# Patient Record
Sex: Male | Born: 1965 | Race: White | Hispanic: No | Marital: Married | Smoking: Never smoker
Health system: Southern US, Community
[De-identification: ages and names within clinical notes are randomized; demographics above are authoritative.]

## PROBLEM LIST (undated history)

## (undated) DIAGNOSIS — E785 Hyperlipidemia, unspecified: Secondary | ICD-10-CM

## (undated) HISTORY — DX: Hyperlipidemia, unspecified: E78.5

---

## 1995-11-04 HISTORY — PX: VASECTOMY: SHX75

## 2004-06-19 ENCOUNTER — Encounter: Admission: RE | Admit: 2004-06-19 | Discharge: 2004-06-19 | Payer: Self-pay | Admitting: Family Medicine

## 2005-03-10 ENCOUNTER — Ambulatory Visit: Payer: Self-pay | Admitting: Family Medicine

## 2005-04-17 ENCOUNTER — Emergency Department (HOSPITAL_COMMUNITY): Admission: EM | Admit: 2005-04-17 | Discharge: 2005-04-18 | Payer: Self-pay | Admitting: Emergency Medicine

## 2005-10-21 ENCOUNTER — Ambulatory Visit: Payer: Self-pay | Admitting: Internal Medicine

## 2006-05-04 ENCOUNTER — Ambulatory Visit: Payer: Self-pay | Admitting: Family Medicine

## 2006-05-05 ENCOUNTER — Encounter: Admission: RE | Admit: 2006-05-05 | Discharge: 2006-05-05 | Payer: Self-pay | Admitting: Family Medicine

## 2006-06-16 ENCOUNTER — Ambulatory Visit: Payer: Self-pay | Admitting: Family Medicine

## 2006-10-20 ENCOUNTER — Ambulatory Visit: Payer: Self-pay | Admitting: Family Medicine

## 2006-10-20 LAB — CONVERTED CEMR LAB
AST: 36 units/L (ref 0–37)
Alkaline Phosphatase: 72 units/L (ref 39–117)
Total Bilirubin: 1.3 mg/dL — ABNORMAL HIGH (ref 0.3–1.2)

## 2006-12-09 ENCOUNTER — Ambulatory Visit: Payer: Self-pay | Admitting: Family Medicine

## 2006-12-09 LAB — CONVERTED CEMR LAB: Total CK: 107 U/L

## 2007-03-31 ENCOUNTER — Ambulatory Visit: Payer: Self-pay | Admitting: Family Medicine

## 2007-04-21 ENCOUNTER — Telehealth (INDEPENDENT_AMBULATORY_CARE_PROVIDER_SITE_OTHER): Payer: Self-pay | Admitting: *Deleted

## 2007-04-26 ENCOUNTER — Encounter (INDEPENDENT_AMBULATORY_CARE_PROVIDER_SITE_OTHER): Payer: Self-pay | Admitting: *Deleted

## 2007-06-28 ENCOUNTER — Ambulatory Visit: Payer: Self-pay | Admitting: Family Medicine

## 2007-06-28 DIAGNOSIS — Q825 Congenital non-neoplastic nevus: Secondary | ICD-10-CM | POA: Insufficient documentation

## 2007-06-28 DIAGNOSIS — E785 Hyperlipidemia, unspecified: Secondary | ICD-10-CM

## 2007-06-28 DIAGNOSIS — R413 Other amnesia: Secondary | ICD-10-CM

## 2007-06-28 DIAGNOSIS — Q674 Other congenital deformities of skull, face and jaw: Secondary | ICD-10-CM

## 2007-06-28 LAB — CONVERTED CEMR LAB
Blood in Urine, dipstick: NEGATIVE
Ketones, urine, test strip: NEGATIVE
Nitrite: NEGATIVE
Specific Gravity, Urine: 1.015
WBC Urine, dipstick: NEGATIVE

## 2007-07-13 ENCOUNTER — Telehealth (INDEPENDENT_AMBULATORY_CARE_PROVIDER_SITE_OTHER): Payer: Self-pay | Admitting: *Deleted

## 2007-07-13 LAB — CONVERTED CEMR LAB
ALT: 39 units/L (ref 0–53)
AST: 31 units/L (ref 0–37)
Albumin: 4 g/dL (ref 3.5–5.2)
BUN: 16 mg/dL (ref 6–23)
Basophils Absolute: 0.1 10*3/uL (ref 0.0–0.1)
Basophils Relative: 0.9 % (ref 0.0–1.0)
Calcium: 9.2 mg/dL (ref 8.4–10.5)
Eosinophils Relative: 4.3 % (ref 0.0–5.0)
GFR calc Af Amer: 107 mL/min
HCT: 40.5 % (ref 39.0–52.0)
Hemoglobin: 14 g/dL (ref 13.0–17.0)
LDL Cholesterol: 114 mg/dL — ABNORMAL HIGH (ref 0–99)
MCHC: 34.6 g/dL (ref 30.0–36.0)
Neutro Abs: 3.4 10*3/uL (ref 1.4–7.7)
Platelets: 274 10*3/uL (ref 150–400)
RBC: 4.57 M/uL (ref 4.22–5.81)
RDW: 12.9 % (ref 11.5–14.6)
Sodium: 142 meq/L (ref 135–145)
Total CHOL/HDL Ratio: 5.7
Vitamin B-12: 271 pg/mL (ref 211–911)

## 2007-07-16 ENCOUNTER — Ambulatory Visit: Payer: Self-pay | Admitting: Family Medicine

## 2007-07-23 ENCOUNTER — Encounter (INDEPENDENT_AMBULATORY_CARE_PROVIDER_SITE_OTHER): Payer: Self-pay | Admitting: *Deleted

## 2007-07-23 LAB — CONVERTED CEMR LAB: Testosterone-% Free: 2.6 % (ref 1.6–2.9)

## 2008-02-18 ENCOUNTER — Ambulatory Visit: Payer: Self-pay | Admitting: Family Medicine

## 2008-02-19 LAB — CONVERTED CEMR LAB
ALT: 39 units/L (ref 0–53)
AST: 27 units/L (ref 0–37)
Albumin: 4.6 g/dL (ref 3.5–5.2)
Direct LDL: 192 mg/dL
HDL: 30.8 mg/dL — ABNORMAL LOW (ref >39.0)
Total Bilirubin: 1.2 mg/dL (ref 0.3–1.2)
Total Protein: 7.4 g/dL (ref 6.0–8.3)
Triglycerides: 196 mg/dL — ABNORMAL HIGH (ref 0–149)
VLDL: 39 mg/dL (ref 0–40)

## 2008-02-21 ENCOUNTER — Encounter (INDEPENDENT_AMBULATORY_CARE_PROVIDER_SITE_OTHER): Payer: Self-pay | Admitting: *Deleted

## 2008-05-29 ENCOUNTER — Telehealth (INDEPENDENT_AMBULATORY_CARE_PROVIDER_SITE_OTHER): Payer: Self-pay | Admitting: *Deleted

## 2008-06-30 ENCOUNTER — Ambulatory Visit: Payer: Self-pay | Admitting: Family Medicine

## 2008-07-12 LAB — CONVERTED CEMR LAB
ALT: 57 units/L — ABNORMAL HIGH (ref 0–53)
Albumin: 4.5 g/dL (ref 3.5–5.2)
Alkaline Phosphatase: 59 units/L (ref 39–117)
Cholesterol: 211 mg/dL (ref 0–200)
Total Bilirubin: 1.3 mg/dL — ABNORMAL HIGH (ref 0.3–1.2)
VLDL: 28 mg/dL (ref 0–40)

## 2008-07-13 ENCOUNTER — Encounter (INDEPENDENT_AMBULATORY_CARE_PROVIDER_SITE_OTHER): Payer: Self-pay | Admitting: *Deleted

## 2008-07-24 ENCOUNTER — Telehealth (INDEPENDENT_AMBULATORY_CARE_PROVIDER_SITE_OTHER): Payer: Self-pay | Admitting: *Deleted

## 2008-11-01 ENCOUNTER — Telehealth (INDEPENDENT_AMBULATORY_CARE_PROVIDER_SITE_OTHER): Payer: Self-pay | Admitting: *Deleted

## 2008-11-06 ENCOUNTER — Telehealth (INDEPENDENT_AMBULATORY_CARE_PROVIDER_SITE_OTHER): Payer: Self-pay | Admitting: *Deleted

## 2009-01-02 ENCOUNTER — Ambulatory Visit: Payer: Self-pay | Admitting: Family Medicine

## 2009-01-02 DIAGNOSIS — J029 Acute pharyngitis, unspecified: Secondary | ICD-10-CM

## 2009-01-08 ENCOUNTER — Encounter (INDEPENDENT_AMBULATORY_CARE_PROVIDER_SITE_OTHER): Payer: Self-pay | Admitting: *Deleted

## 2009-02-01 ENCOUNTER — Telehealth (INDEPENDENT_AMBULATORY_CARE_PROVIDER_SITE_OTHER): Payer: Self-pay | Admitting: *Deleted

## 2009-02-05 ENCOUNTER — Telehealth (INDEPENDENT_AMBULATORY_CARE_PROVIDER_SITE_OTHER): Payer: Self-pay | Admitting: *Deleted

## 2009-02-23 ENCOUNTER — Telehealth (INDEPENDENT_AMBULATORY_CARE_PROVIDER_SITE_OTHER): Payer: Self-pay | Admitting: *Deleted

## 2009-05-18 ENCOUNTER — Telehealth (INDEPENDENT_AMBULATORY_CARE_PROVIDER_SITE_OTHER): Payer: Self-pay | Admitting: *Deleted

## 2009-07-30 ENCOUNTER — Telehealth: Payer: Self-pay | Admitting: Family Medicine

## 2009-08-29 ENCOUNTER — Telehealth (INDEPENDENT_AMBULATORY_CARE_PROVIDER_SITE_OTHER): Payer: Self-pay | Admitting: *Deleted

## 2009-08-31 ENCOUNTER — Ambulatory Visit: Payer: Self-pay | Admitting: Family Medicine

## 2009-09-10 ENCOUNTER — Telehealth (INDEPENDENT_AMBULATORY_CARE_PROVIDER_SITE_OTHER): Payer: Self-pay | Admitting: *Deleted

## 2009-09-10 LAB — CONVERTED CEMR LAB
Albumin: 4.5 g/dL (ref 3.5–5.2)
Alkaline Phosphatase: 48 units/L (ref 39–117)
HDL: 31.3 mg/dL — ABNORMAL LOW (ref >39.00)
Total CHOL/HDL Ratio: 6
Triglycerides: 145 mg/dL (ref 0.0–149.0)
VLDL: 29 mg/dL (ref 0.0–40.0)

## 2009-09-14 ENCOUNTER — Encounter (INDEPENDENT_AMBULATORY_CARE_PROVIDER_SITE_OTHER): Payer: Self-pay | Admitting: *Deleted

## 2010-01-03 ENCOUNTER — Telehealth (INDEPENDENT_AMBULATORY_CARE_PROVIDER_SITE_OTHER): Payer: Self-pay | Admitting: *Deleted

## 2010-01-25 ENCOUNTER — Ambulatory Visit: Payer: Self-pay | Admitting: Family Medicine

## 2010-01-29 LAB — CONVERTED CEMR LAB
ALT: 70 units/L — ABNORMAL HIGH (ref 0–53)
Albumin: 4.6 g/dL (ref 3.5–5.2)
Cholesterol: 168 mg/dL (ref 0–200)
HDL: 34.5 mg/dL — ABNORMAL LOW (ref >39.00)
LDL Cholesterol: 106 mg/dL — ABNORMAL HIGH (ref 0–99)
Total CHOL/HDL Ratio: 5
Triglycerides: 140 mg/dL (ref 0.0–149.0)
VLDL: 28 mg/dL (ref 0.0–40.0)

## 2010-04-02 ENCOUNTER — Telehealth (INDEPENDENT_AMBULATORY_CARE_PROVIDER_SITE_OTHER): Payer: Self-pay | Admitting: *Deleted

## 2010-07-04 ENCOUNTER — Ambulatory Visit: Payer: Self-pay | Admitting: Family Medicine

## 2010-07-11 ENCOUNTER — Telehealth (INDEPENDENT_AMBULATORY_CARE_PROVIDER_SITE_OTHER): Payer: Self-pay | Admitting: *Deleted

## 2010-08-08 ENCOUNTER — Ambulatory Visit: Payer: Self-pay | Admitting: Family Medicine

## 2010-08-14 LAB — CONVERTED CEMR LAB
HDL: 34 mg/dL — ABNORMAL LOW (ref >39.00)
Sodium: 137 meq/L (ref 135–145)
Total CHOL/HDL Ratio: 8
Triglycerides: 235 mg/dL — ABNORMAL HIGH (ref 0.0–149.0)
VLDL: 47 mg/dL — ABNORMAL HIGH (ref 0.0–40.0)

## 2010-08-21 ENCOUNTER — Telehealth (INDEPENDENT_AMBULATORY_CARE_PROVIDER_SITE_OTHER): Payer: Self-pay | Admitting: *Deleted

## 2010-12-01 LAB — CONVERTED CEMR LAB
ALT: 46 units/L — ABNORMAL HIGH (ref 0–40)
AST: 26 units/L (ref 0–37)
Albumin: 4.3 g/dL (ref 3.5–5.2)
Bilirubin, Direct: 0.1 mg/dL (ref 0.0–0.3)

## 2010-12-04 NOTE — Letter (Signed)
Summary: MMSE Form  MMSE Form   Imported By: Lanelle Bal 07/16/2010 10:20:16  _____________________________________________________________________  External Attachment:    Type:   Image     Comment:   External Document

## 2010-12-04 NOTE — Progress Notes (Signed)
Summary: Refill Request  Phone Note Refill Request Call back at [phone] Message from:  Pharmacy on Apr 02, 2010 12:33 PM  Refills Requested: Medication #1:  CRESTOR 20 MG TABS take 1 by mouth at bedtime. NEEDS LABWORK.   Dosage confirmed as above?Dosage Confirmed   Supply Requested: 3 months [facility]  Next Appointment Scheduled: none Initial call taken by: Harold Barban,  Apr 02, 2010 12:33 PM    New/Updated Medications: CRESTOR 20 MG TABS (ROSUVASTATIN CALCIUM) take 1 by mouth at bedtime. Prescriptions: CRESTOR 20 MG TABS (ROSUVASTATIN CALCIUM) take 1 by mouth at bedtime.  #90 x 1   Entered by:   Army Fossa CMA   Authorized by:   Loreen Freud DO   Signed by:   Army Fossa CMA on 04/02/2010   Method used:   Faxed to ...       [facility] ([facility]order)       [address] [place], [place]  [address]       Ph: [phone]       Fax: [phone]   RxID:   [id number]

## 2010-12-04 NOTE — Progress Notes (Signed)
Summary: -NEEDS 30 PILLS INSTEAD OF 15 FOR LIPITOR  Phone Note Call from Patient Call back at [phone]   Caller: Patient Summary of Call: FYI-----PATIENT CALLED TO LET DR LOWNE KNOW THAT $4.00 COPAY CARD FOR LIPITOR IS ONLY GOOD IF YOU GET A 30 DAY SUPPLY ON THE PRESCRIPTION--SINCE HE TAKES IT EVERY OTHER DAY, HE COULD NOT USE THIS CARD TO TAKE ADVANTAGE OF THE SAVINGS--HE WANTS A NOTE IN HIS CHART THAT HE NEEDS 30 PILLS SO HE CAN GET THE SAVINGS  SINCE HE JUST GOT THIS FILLED, HE WONT BE CALLING FOR A REFILL FOR A FEW  WEEKS Initial call taken by: Jerolyn Shin,  August 21, 2010 10:13 AM  Follow-up for Phone Call        pt aware new rx sent in but to still take med every other day..........Marland KitchenFelecia Deloach CMA  August 21, 2010 10:27 AM     New/Updated Medications: LIPITOR 10 MG TABS (ATORVASTATIN CALCIUM) Take 1 tab as directed Prescriptions: LIPITOR 10 MG TABS (ATORVASTATIN CALCIUM) Take 1 tab as directed  #30 x 1   Entered by:   Jeremy Johann CMA   Authorized by:   Loreen Freud DO   Signed by:   Jeremy Johann CMA on 08/21/2010   Method used:   Faxed to ...       [facility] [phone]* (retail)       [address]       [place], [place]  [address]       Ph: [phone]       Fax: [phone]   RxID:   [id number]

## 2010-12-04 NOTE — Assessment & Plan Note (Signed)
Summary: side effect crestor/cbs   Vital Signs:  Patient profile:   45 year old male Weight:      196.8 pounds BMI:     29.38 Temp:     98.6 degrees F oral Pulse rate:   80 / minute Pulse rhythm:   regular BP sitting:   124 / 72  (left arm)  Vitals Entered By: Almeta Monas CMA Duncan Dull) (July 04, 2010 4:07 PM) CC: per pt having a reaction to crestor and wants to discuss changing    History of Present Illness: Pt here c/o memory loss with crestor.  Pt could not even remember a date last week.  No new muscle aches.    Current Medications (verified): 1)  Crestor 20 Mg Tabs (Rosuvastatin Calcium) .... Take 1 By Mouth At Bedtime. 2)  Tricor 145 Mg  Tabs (Fenofibrate) .Marland Kitchen.. 1 By Mouth Once Daily  Allergies: 1)  ! Codeine  Physical Exam  General:  Well-developed,well-nourished,in no acute distress; alert,appropriate and cooperative throughout examination Psych:  Cognition and judgment appear intact. Alert and cooperative with normal attention span and concentration. No apparent delusions, illusions, hallucinations   Impression & Recommendations:  Problem # 1:  HYPERLIPIDEMIA (ICD-272.4)  His updated medication list for this problem includes:    Crestor 20 Mg Tabs (Rosuvastatin calcium) .Marland Kitchen... Take 1 by mouth at bedtime.    Tricor 145 Mg Tabs (Fenofibrate) .Marland Kitchen... 1 by mouth once daily  Labs Reviewed: SGOT: 53 (01/25/2010)   SGPT: 70 (01/25/2010)   HDL:34.50 (01/25/2010), 31.30 (08/31/2009)  LDL:106 (01/25/2010), DEL (06/30/2008)  Chol:168 (01/25/2010), 203 (08/31/2009)  Trig:140.0 (01/25/2010), 145.0 (08/31/2009)  Problem # 2:  MEMORY LOSS (ICD-780.93) MMSE 29/20 hold crestor 2 weeks   Complete Medication List: 1)  Crestor 20 Mg Tabs (Rosuvastatin calcium) .... Take 1 by mouth at bedtime. 2)  Tricor 145 Mg Tabs (Fenofibrate) .Marland Kitchen.. 1 by mouth once daily  Patient Instructions: 1)  hold crestor for 2 weeks---call office to let us know how you are feeling.    Prescriptions: TRICOR 145 MG  TABS (FENOFIBRATE) 1 by mouth once daily**LABS DUE NOW**  #90 x 3   Entered and Authorized by:   Loreen Freud DO   Signed by:   Loreen Freud DO on 07/04/2010   Method used:   Faxed to ...       [facility] (mail-order)       [address] [place], [place]  [address]       Ph: [phone]       Fax: [phone]   RxID:   [id number]

## 2010-12-04 NOTE — Progress Notes (Signed)
Summary: Refill Request  Phone Note Refill Request Message from:  Pharmacy on [facility] Fax #: [phone]  Refills Requested: Medication #1:  CRESTOR 20 MG TABS take 1 by mouth at bedtime   Dosage confirmed as above?Dosage Confirmed   Supply Requested: 3 months Next Appointment Scheduled: none Initial call taken by: Harold Barban,  January 03, 2010 9:04 AM    New/Updated Medications: CRESTOR 20 MG TABS (ROSUVASTATIN CALCIUM) take 1 by mouth at bedtime. NEEDS LABWORK. Prescriptions: CRESTOR 20 MG TABS (ROSUVASTATIN CALCIUM) take 1 by mouth at bedtime. NEEDS LABWORK.  #90 x 0   Entered by:   Army Fossa CMA   Authorized by:   Loreen Freud DO   Signed by:   Army Fossa CMA on 01/03/2010   Method used:   [facility]ronically [facility]        [facility]       [address]       [place], [place]  [address]       Ph: [phone]       Fax: [phone]   RxID:   [id number]

## 2010-12-04 NOTE — Progress Notes (Signed)
Summary:  FYI changes in patient health (lmom 9/9 9/13)  Phone Note Call from Patient   Caller: Spouse Summary of Call: Pt wife states that since starting cholesterol  med pt has been very moody, and irritable. Pt has become very distance from his family because no one wants to deal with him due to his mood swing. Pt wife states that pt denies any of these symptoms and only thinks that med is affecting his memory. Pt wife would like to let dr Laury Axon know this because pt will not admit to it. Advise pt wife if she is having concern with pt health it would be good idea to come in with pt at [facility] so that concerns can be expressed. Pt wife decline stating that she does not want pt to hear it from her but she rather dr Laury Axon ask him the questions......................Marland KitchenFelecia Deloach CMA  July 11, 2010 9:34 AM   Follow-up for Phone Call        we took him off the med-- but he has to admit to me there is a problem for me to treat it---she should come in with him. Follow-up by: Loreen Freud DO,  July 11, 2010 9:49 AM  Additional Follow-up for Phone Call Additional follow up Details #1::        Left message to call back Almeta Monas CMA Duncan Dull)  July 12, 2010 3:22 PM  Left message to call back Almeta Monas CMA Duncan Dull)  July 16, 2010 8:23 AM     Additional Follow-up for Phone Call Additional follow up Details #2::    Spk with pt and said he sypmtoms have improved since he has been off of the crestor, he will call in an schdeule a lab appt to have his cholesterol rechecked later this month. Currently denies any other problems. Call ended Follow-up by: Almeta Monas CMA Duncan Dull),  July 16, 2010 12:39 PM

## 2011-03-04 ENCOUNTER — Encounter: Payer: Self-pay | Admitting: Family Medicine

## 2011-03-05 ENCOUNTER — Encounter: Payer: Self-pay | Admitting: Family Medicine

## 2011-03-05 ENCOUNTER — Ambulatory Visit (INDEPENDENT_AMBULATORY_CARE_PROVIDER_SITE_OTHER): Payer: BC Managed Care – PPO | Admitting: Family Medicine

## 2011-03-05 VITALS — BP 100/66 | HR 76 | Temp 98.4°F | Wt 193.0 lb

## 2011-03-05 DIAGNOSIS — M545 Low back pain: Secondary | ICD-10-CM

## 2011-03-05 DIAGNOSIS — M549 Dorsalgia, unspecified: Secondary | ICD-10-CM

## 2011-03-05 LAB — POCT URINALYSIS DIPSTICK
Glucose, UA: NEGATIVE
Nitrite, UA: NEGATIVE
Spec Grav, UA: 1.025
Urobilinogen, UA: 0.2

## 2011-03-05 MED ORDER — CYCLOBENZAPRINE HCL 10 MG PO TABS: 10.0000 mg | ORAL_TABLET | Freq: Three times a day (TID) | ORAL | Status: DC | PRN

## 2011-03-05 NOTE — Progress Notes (Signed)
  Subjective:    Glenn Nelson is a 45 y.o. male who presents for evaluation of low back pain. The patient has had no prior back problems. Symptoms have been present for 2 weeks and are unchanged.  Onset was related to / precipitated by no known injury and he was playing ice hockey a few weeks ago. The pain is located in the right lumbar area and radiates to the right lower leg, left lower leg. The pain is described as spasms and occurs intermittently. He rates his pain as mild. Symptoms are exacerbated by lying down, sitting, standing and twisting. Symptoms are improved by NSAIDs. He has also tried nothing which provided no symptom relief. He has tingling in the right leg and tingling in the left leg associated with the back pain. The patient has no "red flag" history indicative of complicated back pain.  The following portions of the patient's history were reviewed and updated as appropriate: allergies, current medications, past family history, past medical history, past social history, past surgical history and problem list.  Review of Systems Pertinent items are noted in HPI.    Objective:   Full range of motion without pain, no tenderness, no spasm, no curvature. Normal reflexes, gait, strength and negative straight-leg raise.    Assessment:    Nonspecific acute low back pain    Plan:    Natural history and expected course discussed. Questions answered. Agricultural engineer distributed. Heat to affected area as needed for local pain relief. NSAIDs per medication orders. Muscle relaxants per medication orders. f/u prn

## 2011-03-05 NOTE — Progress Notes (Deleted)
  Subjective:    Glenn Nelson is a 45 y.o. male who presents for follow up of low back problems. Current symptoms include: {back symptoms:16447}. Symptoms have {clinical course:13112} from the previous visit. Exacerbating factors identified by the patient are {back exacerbating/alleviating factors:16449}.  {Common ambulatory SmartLinks:19316}    Objective:    {Exam, Complete:17964}    Assessment:    {diagnosis; back pain:16452}    Plan:    {Plan; back pain:10213}  Subjective:    Glenn Nelson is a 45 y.o. male who presents for evaluation of low back pain. The patient has had no prior back problems. Symptoms have been present for 2 weeks and are unchanged.  Onset was related to / precipitated by --no known injury but he was playing ice hockey a few weeks ago . The pain is located in the right lumbar area and radiates to the right lower leg, left lower leg. The pain is described as spasms and occurs intermittently. He rates his pain as mild. Symptoms are exacerbated by extension, flexion, lying down, sitting, standing and twisting. Symptoms are improved by nothing. He has also tried nothing which provided no symptom relief. He has tingling in the right leg and tingling in the left leg associated with the back pain. The patient has no "red flag" history indicative of complicated back pain.  The following portions of the patient's history were reviewed and updated as appropriate: allergies, current medications, past family history, past medical history, past social history, past surgical history and problem list.  Review of Systems Pertinent items are noted in HPI.    Objective:   Full range of motion without pain, no tenderness, no spasm, no curvature. Normal reflexes, gait, strength and negative straight-leg raise.    Assessment:    Nonspecific acute low back pain    Plan:    Natural history and expected course discussed. Questions answered. Agricultural engineer distributed. Proper  lifting, bending technique discussed. Heat to affected area as needed for local pain relief. NSAIDs per medication orders. Muscle relaxants per medication orders. f/u prn

## 2011-03-05 NOTE — Patient Instructions (Signed)
Back Pain (Lumbosacral Strain) Back pain is one of the most common causes of pain. There are many causes of back pain. Most are not serious conditions.  CAUSES Your backbone (spinal column) is made up of 24 main vertebral bodies, the sacrum, and the coccyx. These are held together by muscles and tough, fibrous tissue (ligaments). Nerve roots pass through the openings between the vertebrae. A sudden move or injury to the back may cause injury to, or pressure on, these nerves. This may result in localized back pain or pain movement (radiation) into the buttocks, down the leg, and into the foot. Sharp, shooting pain from the buttock down the back of the leg (sciatica) is frequently associated with a ruptured (herniated) disc. Pain may be caused by muscle spasm alone. Your caregiver can often find the cause of your pain by the details of your symptoms and an exam. In some cases, you may need tests (such as X-rays). Your caregiver will work with you to decide if any tests are needed based on your specific exam. HOME CARE INSTRUCTIONS  Avoid an underactive lifestyle. Active exercise, as directed by your caregiver, is your greatest weapon against back pain.   Avoid hard physical activities (tennis, racquetball, water-skiing) if you are not in proper physical condition for it. This may aggravate and/or create problems.   If you have a back problem, avoid sports requiring sudden body movements. Swimming and walking are generally safer activities.   Maintain good posture.   Avoid becoming overweight (obese).   Use bed rest for only the most extreme, sudden (acute) episode. Your caregiver will help you determine how much bed rest is necessary.   For acute conditions, you may put ice on the injured area.   Put ice in a plastic bag.   Place a towel between your skin and the bag.   Leave the ice on for 15 minutes at a time, every 2 hours, or as needed.   After you are improved and more active, it may  help to apply heat for 30 minutes before activities.  See your caregiver if you are having pain that lasts longer than expected. Your caregiver can advise appropriate exercises and/or therapy if needed. With conditioning, most back problems can be avoided. SEEK IMMEDIATE MEDICAL CARE IF:  You have numbness, tingling, weakness, or problems with the use of your arms or legs.   You experience severe back pain not relieved with medicines.   There is a change in bowel or bladder control.   You have increasing pain in any area of the body, including your belly (abdomen).   You notice shortness of breath, dizziness, or feel faint.   You feel sick to your stomach (nauseous), are throwing up (vomiting), or become sweaty.   You notice discoloration of your toes or legs, or your feet get very cold.   Your back pain is getting worse.   You have an oral temperature above 100.4, not controlled by medicine.  MAKE SURE YOU:   Understand these instructions.   Will watch your condition.   Will get help right away if you are not doing well or get worse.  Document Released: 07/30/2005 Document Re-Released: 01/14/2010 [employer] Patient Information 2011 [employer]. 

## 2011-04-04 ENCOUNTER — Other Ambulatory Visit: Payer: Self-pay | Admitting: Family Medicine

## 2011-04-04 MED ORDER — ATORVASTATIN CALCIUM 10 MG PO TABS: ORAL_TABLET | ORAL | Status: DC

## 2011-04-04 NOTE — Telephone Encounter (Signed)
Labs are overdue---Letter mailed      KP 

## 2011-05-15 ENCOUNTER — Other Ambulatory Visit: Payer: BC Managed Care – PPO

## 2011-05-15 NOTE — Progress Notes (Signed)
Labs only

## 2011-05-27 ENCOUNTER — Other Ambulatory Visit: Payer: Self-pay | Admitting: Family Medicine

## 2011-05-27 MED ORDER — ATORVASTATIN CALCIUM 10 MG PO TABS: ORAL_TABLET | ORAL | Status: DC

## 2011-05-27 NOTE — Telephone Encounter (Signed)
FAXED     KP 

## 2011-06-19 ENCOUNTER — Ambulatory Visit (INDEPENDENT_AMBULATORY_CARE_PROVIDER_SITE_OTHER): Payer: BC Managed Care – PPO | Admitting: Family Medicine

## 2011-06-19 VITALS — BP 110/82 | HR 77 | Temp 98.1°F | Wt 197.0 lb

## 2011-06-19 DIAGNOSIS — E785 Hyperlipidemia, unspecified: Secondary | ICD-10-CM

## 2011-06-19 MED ORDER — COQ10 100 MG PO CAPS: ORAL_CAPSULE | ORAL | Status: DC

## 2011-06-19 MED ORDER — PITAVASTATIN CALCIUM 2 MG PO TABS: ORAL_TABLET | ORAL | Status: DC

## 2011-06-19 NOTE — Assessment & Plan Note (Signed)
Try livalo 2 mg daily---if memory is affected again---try welcol Recheck 3 months

## 2011-06-19 NOTE — Patient Instructions (Signed)

## 2011-06-19 NOTE — Progress Notes (Signed)
  Subjective:    Patient ID: Glenn Nelson, male    DOB: 1966/08/29, 45 y.o.   MRN: [id number]  HPI Pt here to review bhl.  No complatins.   Memory improves when statin stopped.  On no statin now   Review of Systems As above    Objective:   Physical Exam  Constitutional: He is oriented to person, place, and time. He appears well-developed and well-nourished.  Neurological: He is alert and oriented to person, place, and time.  Psychiatric: He has a normal mood and affect. His behavior is normal. Judgment and thought content normal.          Assessment & Plan:

## 2011-06-24 ENCOUNTER — Encounter: Payer: Self-pay | Admitting: Family Medicine

## 2011-07-30 ENCOUNTER — Telehealth: Payer: Self-pay | Admitting: *Deleted

## 2011-08-08 ENCOUNTER — Other Ambulatory Visit: Payer: Self-pay | Admitting: Family Medicine

## 2011-08-08 DIAGNOSIS — E785 Hyperlipidemia, unspecified: Secondary | ICD-10-CM

## 2011-08-08 NOTE — Telephone Encounter (Signed)
Awaiting fax.

## 2011-08-08 NOTE — Telephone Encounter (Signed)
In progress    KP

## 2011-08-08 NOTE — Telephone Encounter (Signed)
Patient needs a prior auth for this medication

## 2011-08-11 NOTE — Telephone Encounter (Signed)
PA in progress. 

## 2011-08-12 NOTE — Telephone Encounter (Signed)
PA faxed awaiting response 

## 2011-08-14 ENCOUNTER — Other Ambulatory Visit: Payer: Self-pay

## 2011-08-14 DIAGNOSIS — E785 Hyperlipidemia, unspecified: Secondary | ICD-10-CM

## 2011-08-14 MED ORDER — PITAVASTATIN CALCIUM 2 MG PO TABS: ORAL_TABLET | ORAL | Status: DC

## 2011-08-14 NOTE — Telephone Encounter (Signed)
Prior Auth approved 08-07-11 until 02-05-12, pharmacy notified via fax, approval letter scan to chart.

## 2011-08-14 NOTE — Telephone Encounter (Signed)
Refaxed   KP 

## 2011-09-03 ENCOUNTER — Other Ambulatory Visit: Payer: Self-pay | Admitting: Family Medicine

## 2011-09-03 MED ORDER — FENOFIBRATE 145 MG PO TABS: 145.0000 mg | ORAL_TABLET | Freq: Every day | ORAL | Status: DC

## 2011-09-03 NOTE — Telephone Encounter (Signed)
Faxed.   KP 

## 2011-09-18 ENCOUNTER — Other Ambulatory Visit: Payer: Self-pay | Admitting: Family Medicine

## 2011-09-18 DIAGNOSIS — E785 Hyperlipidemia, unspecified: Secondary | ICD-10-CM

## 2011-09-19 ENCOUNTER — Other Ambulatory Visit (INDEPENDENT_AMBULATORY_CARE_PROVIDER_SITE_OTHER): Payer: BC Managed Care – PPO

## 2011-09-19 DIAGNOSIS — E785 Hyperlipidemia, unspecified: Secondary | ICD-10-CM

## 2011-09-19 LAB — BASIC METABOLIC PANEL
BUN: 18 mg/dL (ref 6–23)
CO2: 26 mEq/L (ref 19–32)
Glucose, Bld: 89 mg/dL (ref 70–99)

## 2011-09-19 LAB — HEPATIC FUNCTION PANEL
ALT: 47 U/L (ref 0–53)
AST: 24 U/L (ref 0–37)
Alkaline Phosphatase: 62 U/L (ref 39–117)
Bilirubin, Direct: 0 mg/dL (ref 0.0–0.3)
Total Bilirubin: 0.5 mg/dL (ref 0.3–1.2)
Total Protein: 8 g/dL (ref 6.0–8.3)

## 2011-09-19 LAB — LIPID PANEL: VLDL: 35 mg/dL (ref 0.0–40.0)

## 2011-09-19 NOTE — Progress Notes (Signed)
12  

## 2011-09-24 ENCOUNTER — Encounter: Payer: Self-pay | Admitting: *Deleted

## 2011-09-24 ENCOUNTER — Telehealth: Payer: Self-pay

## 2011-09-24 NOTE — Telephone Encounter (Signed)
Message copied by Arnette Norris on Wed Sep 24, 2011  3:22 PM ------      Message from: Lelon Perla      Created: Mon Sep 22, 2011  6:44 AM       Cholesterol--- LDL goal < 100,  HDL >40,  TG < 150.  Diet and exercise will increase HDL and decrease LDL and TG.  Fish,  Fish Oil, Flaxseed oil will also help increase the HDL and decrease Triglycerides.   Recheck labs in 3 months.   Increase Livalo 4mg  #30  1 po qhs ,  2 refills----- 272.4  Lipid, hep

## 2011-09-24 NOTE — Telephone Encounter (Signed)
Discussed with patient and he voiced understanding, He stated he wanted to know if there was something else that he can take that was cheaper than Livalo because his insurance will not cover it. I advised patient the best thing to do is submit a formulary and Dr. Laury Axon can see what his insurance will cover, in the meantime he can double up the 2 mg Livalo and if he runs out before he gets the formulary then he can call to see if we have samples available. He voiced understanding and agreed, he stated he would call one day early next week with the formulary.      KP

## 2011-10-13 ENCOUNTER — Other Ambulatory Visit: Payer: Self-pay

## 2011-10-13 MED ORDER — PITAVASTATIN CALCIUM 4 MG PO TABS: 1.0000 | ORAL_TABLET | Freq: Every day | ORAL | Status: DC

## 2011-12-04 ENCOUNTER — Encounter: Payer: Self-pay | Admitting: Family Medicine

## 2011-12-04 ENCOUNTER — Ambulatory Visit (INDEPENDENT_AMBULATORY_CARE_PROVIDER_SITE_OTHER): Payer: BC Managed Care – PPO | Admitting: Family Medicine

## 2011-12-04 ENCOUNTER — Ambulatory Visit (HOSPITAL_BASED_OUTPATIENT_CLINIC_OR_DEPARTMENT_OTHER)
Admission: RE | Admit: 2011-12-04 | Discharge: 2011-12-04 | Disposition: A | Discharge status: Home / Self Care | Payer: BC Managed Care – PPO | Source: Ambulatory Visit | Attending: Family Medicine | Admitting: Family Medicine

## 2011-12-04 VITALS — BP 156/95 | HR 104 | Temp 98.1°F | Ht 69.0 in | Wt 175.0 lb

## 2011-12-04 DIAGNOSIS — R0781 Pleurodynia: Secondary | ICD-10-CM

## 2011-12-04 DIAGNOSIS — R0789 Other chest pain: Secondary | ICD-10-CM | POA: Insufficient documentation

## 2011-12-04 DIAGNOSIS — R079 Chest pain, unspecified: Secondary | ICD-10-CM

## 2011-12-04 DIAGNOSIS — R071 Chest pain on breathing: Secondary | ICD-10-CM

## 2011-12-04 DIAGNOSIS — X500XXA Overexertion from strenuous movement or load, initial encounter: Secondary | ICD-10-CM

## 2011-12-04 DIAGNOSIS — S298XXA Other specified injuries of thorax, initial encounter: Secondary | ICD-10-CM

## 2011-12-04 DIAGNOSIS — R109 Unspecified abdominal pain: Secondary | ICD-10-CM

## 2011-12-04 NOTE — Assessment & Plan Note (Signed)
radiographs negative for rib fracture.  Given this was a non-contact injury, occurred with twisting, felt a pop/pull, normal radiographs, this is most consistent with a severe oblique strain.  Initial injury almost 2 months ago with recurrence over a week ago.  No evidence of splenic rupture or history that would suggest rupture.  Discussed red flag symptoms that would warrant emergent/urgent evaluation.  Start with rest, icing, tylenol as needed.  Once pain starts to abate more, advised to call us and would start him in formal PT. Out of hockey in the meantime.

## 2011-12-04 NOTE — Patient Instructions (Addendum)
You have a severe oblique strain. Your x-rays of your ribs were negative for a fracture. The spleen sits under the left rib cage but it would be exceedingly uncommon for this to have damage to it - typically this comes from a direct blow (in an accident, checked from behind) or in someone who has mono. This also usually progresses quickly within 1-2 days and doesn't hurt over several days to weeks. If you start vomiting, notice worsening abdominal pain (instead of improving), significant bruising, call me - the next step would be a CT or abdominal ultrasound (this is very unlikely). Ice the area for 15 minutes at a time 3-4 times a day. Tylenol 500mg  1-2 tabs three times a day for pain. Can take aleve 1-2 tabs twice a day as needed in addition to this. Compression wrap may be helpful. When pain is about a 1-2/10, would recommend starting physical therapy - call me when you get to this point. Follow up with me in 1 month. I would recommend holding off on hockey for now.  Ok for biking, running, other activities that do not involve twisting motions or contact.

## 2011-12-04 NOTE — Progress Notes (Signed)
Subjective:    Patient ID: Glenn Nelson, male    DOB: September 08, 1966, 46 y.o.   MRN: [id number]  PCP: Dr. Laury Axon  HPI 46 yo M here for left sided ?rib pain.  Patient reports approximately 6-8 weeks ago he was pulling a heavy garbage can with left hand when he felt a pop in left side lower ribs with pain that lasted for several weeks. Did not have swelling or bruising at the time. + pleuritic chest pain after this - worse with coughing also. No vomiting, hematochezia, other complaints. This almost completely improved until about 8 days ago recalls reinjuring this with a twisting motion. Took some time off - had pain worse with deep breath again. Then last night while playing hockey felt pain in same area when hitting slap shots - during game when turning to the left felt a pop again left lower ribcage/abdomen area. + swelling and minimal bruising. Some nausea - no vomiting, other abdominal symptoms. Taking tylenol for pain. Does not recall a direct blow to his back or ribs at any point in past 2 weeks. Has had several colds in past 3 months but no h/o mono and not sick past couple weeks either.  Past Medical History  Diagnosis Date  . Hyperlipidemia     Current Outpatient Prescriptions on File Prior to Visit  Medication Sig Dispense Refill  . Coenzyme Q10 (COQ10) 100 MG CAPS 1 po qd  30 each    . fenofibrate (TRICOR) 145 MG tablet Take 1 tablet (145 mg total) by mouth daily.  90 tablet  0  . Pitavastatin Calcium 4 MG TABS Take 1 tablet (4 mg total) by mouth daily.  30 tablet  0    Past Surgical History  Procedure Date  . Vasectomy 1997    Allergies  Allergen Reactions  . Codeine     REACTION: HAZY,    History   Social History  . Marital Status: Married    Spouse Name: N/A    Number of Children: N/A  . Years of Education: N/A   Occupational History  . Automotive engineer Controls   Social History Main Topics  . Smoking status: Never Smoker   . Smokeless tobacco:  Not on file  . Alcohol Use: Yes  . Drug Use: No  . Sexually Active: Not on file   Other Topics Concern  . Not on file   Social History Narrative  . No narrative on file    Family History  Problem Relation Age of Onset  . Adopted: Yes  . Diabetes    . Lymphoma      BP 156/95  Pulse 104  Temp(Src) 98.1 F (36.7 C) (Oral)  Ht 5\' 9"  (1.753 m)  Wt 175 lb (79.379 kg)  BMI 25.84 kg/m2  Review of Systems See HPI above.    Objective:   Physical Exam Gen: NAD Chest: No gross deformity, swelling or bruising of thoracic wall. TTP ~9th and 10th ribs anterolaterally but mostly in obliques (see below). FROM but pain with deep breath. Lungs CTAB without wheezes, rales, rhonchi.  No respiratory distress.  Abd: soft, mild swelling but no palpable hematoma.  Very small area of bruising. Tender left side lower abdomen superficially over left obliques. No rigidity. No rebound or guarding. No palpable splenomegaly. Pain worse with bilateral lateral rotation.    Assessment & Plan:  1. Left side pain - radiographs negative for rib fracture.  Given this was a non-contact injury, occurred with twisting, felt  a pop/pull, normal radiographs, this is most consistent with a severe oblique strain.  Initial injury almost 2 months ago with recurrence over a week ago.  No evidence of splenic rupture or history that would suggest rupture.  Discussed red flag symptoms that would warrant emergent/urgent evaluation.  Start with rest, icing, tylenol as needed.  Once pain starts to abate more, advised to call us and would start him in formal PT. Out of hockey in the meantime.

## 2011-12-05 ENCOUNTER — Telehealth: Payer: Self-pay | Admitting: Family Medicine

## 2011-12-05 MED ORDER — FENOFIBRATE 145 MG PO TABS: 145.0000 mg | ORAL_TABLET | Freq: Every day | ORAL | Status: DC

## 2011-12-05 NOTE — Telephone Encounter (Signed)
Faxed

## 2011-12-05 NOTE — Telephone Encounter (Signed)
Refill- tricor tablet. 90 day supply.

## 2011-12-12 ENCOUNTER — Telehealth: Payer: Self-pay | Admitting: Family Medicine

## 2011-12-12 MED ORDER — PITAVASTATIN CALCIUM 4 MG PO TABS: 1.0000 | ORAL_TABLET | Freq: Every day | ORAL | Status: DC

## 2011-12-12 NOTE — Telephone Encounter (Signed)
Rx faxed.    KP 

## 2011-12-12 NOTE — Telephone Encounter (Signed)
Refill: Livalo 4 mg tablet. Take 1 tablet by mouth every day. Qty. 30. Last fill 11-07-11

## 2011-12-15 ENCOUNTER — Other Ambulatory Visit: Payer: Self-pay | Admitting: Family Medicine

## 2011-12-15 MED ORDER — FENOFIBRATE 145 MG PO TABS: 145.0000 mg | ORAL_TABLET | Freq: Every day | ORAL | Status: DC

## 2011-12-15 NOTE — Telephone Encounter (Signed)
Letter mailed     KP 

## 2011-12-26 ENCOUNTER — Other Ambulatory Visit: Payer: Self-pay | Admitting: Family Medicine

## 2011-12-26 ENCOUNTER — Other Ambulatory Visit: Payer: BC Managed Care – PPO

## 2011-12-26 DIAGNOSIS — E785 Hyperlipidemia, unspecified: Secondary | ICD-10-CM

## 2011-12-30 ENCOUNTER — Other Ambulatory Visit (INDEPENDENT_AMBULATORY_CARE_PROVIDER_SITE_OTHER): Payer: BC Managed Care – PPO

## 2011-12-30 DIAGNOSIS — E785 Hyperlipidemia, unspecified: Secondary | ICD-10-CM

## 2011-12-30 LAB — LIPID PANEL
Cholesterol: 219 mg/dL — ABNORMAL HIGH (ref 0–200)
Total CHOL/HDL Ratio: 6
Triglycerides: 194 mg/dL — ABNORMAL HIGH (ref 0.0–149.0)
VLDL: 38.8 mg/dL (ref 0.0–40.0)

## 2011-12-30 LAB — HEPATIC FUNCTION PANEL: Total Bilirubin: 0.9 mg/dL (ref 0.3–1.2)

## 2011-12-31 NOTE — Progress Notes (Signed)
Refer to lipid clinic 

## 2012-01-01 ENCOUNTER — Other Ambulatory Visit: Payer: Self-pay

## 2012-01-01 DIAGNOSIS — E785 Hyperlipidemia, unspecified: Secondary | ICD-10-CM

## 2012-01-15 ENCOUNTER — Ambulatory Visit (INDEPENDENT_AMBULATORY_CARE_PROVIDER_SITE_OTHER): Payer: BC Managed Care – PPO | Admitting: Pharmacist

## 2012-01-15 VITALS — BP 134/90 | Wt 194.0 lb

## 2012-01-15 DIAGNOSIS — E785 Hyperlipidemia, unspecified: Secondary | ICD-10-CM

## 2012-01-15 MED ORDER — NIACIN ER (ANTIHYPERLIPIDEMIC) 500 MG PO TBCR: 500.0000 mg | EXTENDED_RELEASE_TABLET | Freq: Every day | ORAL | Status: DC

## 2012-01-15 NOTE — Progress Notes (Signed)
HPI  Glenn Nelson is a 46yo M who was referred to the Lipid clinic by Dr. Laury Axon.  His PMH is significant only for hyperlipidemia.   He has no history of diabetes, HTN, or CAD.  He is not a smoker.  He has tried almost every statin in the past including Lipitor, Crestor, Zocor and pravachol.  He is currently on Livalo 2mg  daily and Tricor 145mg  daily.  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  He reports this was to the point he almost lost his job because of his inability to complete tasks.  His other concern was mood changes related to statin use.   His family history is relatively unknown.  He was adopted and all he knows is that his biological mother died of cancer but many other family members died early.  He does not know what the cause was.  Of his siblings he knows, none of them have significant health problems.   For exercise, the patient plays ice hockey 2 days per week.   Diet:  Breakfast- toast with peanut butter (protein), 2 cups coffee Lunch- Sandwich Dinner- varied - varies carb count based on activity level.  He only eats red meat 2-3 days/week  Eats out only 1 every 2-3 weeks.  He likes to cook and prefers to eat at home. No snacks Drinks water with most meals.   Current Outpatient Prescriptions on File Prior to Visit  Medication Sig Dispense Refill  . fenofibrate (TRICOR) 145 MG tablet Take 1 tablet (145 mg total) by mouth daily.  90 tablet  0   Allergies  Allergen Reactions  . Codeine     REACTION: HAZY,

## 2012-01-15 NOTE — Patient Instructions (Addendum)
Stop Livalo  Start Niaspan 500mg  daily.  If you have flushing, try taking aspirin 81mg  30 minutes prior to the Niaspan.  If you have any intolerable side effects, please call Kennon Rounds at [phone].   Continue to eat a healthy diet and exercise on a regular basis.   We will check your labs again in 4-6 weeks.  I will call you with an appt.

## 2012-01-19 NOTE — Assessment & Plan Note (Signed)
Pt's current cholesterol: TC- 219 (goal<200), TG- 194 (goal<150), HDL- 36.4 (goal>40), LDL- 308 (goal<130; aggresively <100).    Reviewed results of recent [facility] test.  ApoB elevated at 176 (goal<80) and high % of small particle LDL.  CRP was normal at 1, but pt thinks he was on statin when this test was done.  Since pt has had some many issues tolerating all statins, will stop Livalo at this time and proceed with different class of meds.  Will start Niacin 500mg  daily and continue Tricor 145mg .  Encouarged continued lifestyle modification. Pt is up ~15 lbs since last office visit with PCP.  Will recheck labs in 1 month with goal of titrating Niacin to 1000mg  daily if needed.

## 2012-01-29 ENCOUNTER — Other Ambulatory Visit: Payer: Self-pay | Admitting: Pharmacist

## 2012-01-29 DIAGNOSIS — E785 Hyperlipidemia, unspecified: Secondary | ICD-10-CM

## 2012-02-04 ENCOUNTER — Other Ambulatory Visit (INDEPENDENT_AMBULATORY_CARE_PROVIDER_SITE_OTHER): Payer: BC Managed Care – PPO

## 2012-02-04 DIAGNOSIS — E785 Hyperlipidemia, unspecified: Secondary | ICD-10-CM

## 2012-02-04 LAB — HEPATIC FUNCTION PANEL
ALT: 44 U/L (ref 0–53)
Alkaline Phosphatase: 42 U/L (ref 39–117)
Bilirubin, Direct: 0 mg/dL (ref 0.0–0.3)
Total Bilirubin: 0.5 mg/dL (ref 0.3–1.2)
Total Protein: 7.6 g/dL (ref 6.0–8.3)

## 2012-02-09 ENCOUNTER — Ambulatory Visit (INDEPENDENT_AMBULATORY_CARE_PROVIDER_SITE_OTHER): Payer: BC Managed Care – PPO | Admitting: Pharmacist

## 2012-02-09 DIAGNOSIS — E785 Hyperlipidemia, unspecified: Secondary | ICD-10-CM

## 2012-02-09 MED ORDER — NIACIN ER (ANTIHYPERLIPIDEMIC) 500 MG PO TBCR: 1500.0000 mg | EXTENDED_RELEASE_TABLET | Freq: Every day | ORAL | Status: DC

## 2012-02-09 NOTE — Patient Instructions (Addendum)
Increase your Niacin to 1000mg  for the next few weeks.  If you don't have any problems, increase to 1500mg  on 03/08/12.    Start taking aspirin 81mg  30 minutes to 1 hour prior to Niacin to help with flushing.   We will recheck your labwork in 2 months.  I will call you for your appt.

## 2012-02-10 NOTE — Assessment & Plan Note (Signed)
Pt's cholesterol worse since stopping Livalo  TC- 300, TG- 151, HDL-39, LDL- 237 (goal<130).  LFTs are WNL.  Pt does not want to start statin again given his improvement in symptoms since off all statins.  Discussed several options with the patient including titrating Niaspan or starting Zetia.  Will try to optimize Niaspan dose prior to adding additional therapy.  Will plan on titrating up 500mg  every 4 weeks to a goal of 1500mg  prior to next visit.  Pt will try to take aspirin 81mg  prior to Niacin to help with flushing.  Will f/u in 2 months.

## 2012-02-10 NOTE — Progress Notes (Signed)
HPI  Glenn Nelson is a 46yo M who is seen for follow up in the Lipid clinic.  His PMH is significant only for hyperlipidemia.   He has no history of diabetes, HTN, or CAD.  He is not a smoker. At the last visit, we stopped his Livalo due to impaired memory and cognitive function and started Niaspan 500mg  once daily.  He states his memory function has greatly improved and he is remembering things much better.  He has been able to tolerate Niaspan with little flushing.  The only times he has had flushing have been related to alcohol intake with the Niaspan.   For exercise, the patient plays ice hockey 2 days per week for about 1 1/2 hours  Diet: - He has tried to cut down his portions since last visit.  Breakfast- toast with peanut butter (protein), 2 cups coffee Lunch- Sandwich Dinner- varied - varies carb count based on activity level.  He only eats red meat 2-3 days/week  Eats out only 1 every 2-3 weeks.  He likes to cook and prefers to eat at home. No snacks Drinks water with most meals.   Current Outpatient Prescriptions on File Prior to Visit  Medication Sig Dispense Refill  . fenofibrate (TRICOR) 145 MG tablet Take 1 tablet (145 mg total) by mouth daily.  90 tablet  0  . Multiple Vitamin (MULTIVITAMIN) tablet Take 1 tablet by mouth daily.      . niacin (NIASPAN) 500 MG CR tablet Take 3 tablets (1,500 mg total) by mouth at bedtime.  270 tablet  0  . DISCONTD: Pitavastatin Calcium (LIVALO) 2 MG TABS Take 2 mg by mouth daily.       Allergies  Allergen Reactions  . Codeine     REACTION: HAZY,

## 2012-02-11 IMAGING — CR DG RIBS 2V*L*
3 series · 3 of 3 positions shown · non-contrast
Comparison: None

CLINICAL DATA: Twisting injury

LEFT RIBS - 2 VIEW

[w ribs ap/pa upper left]
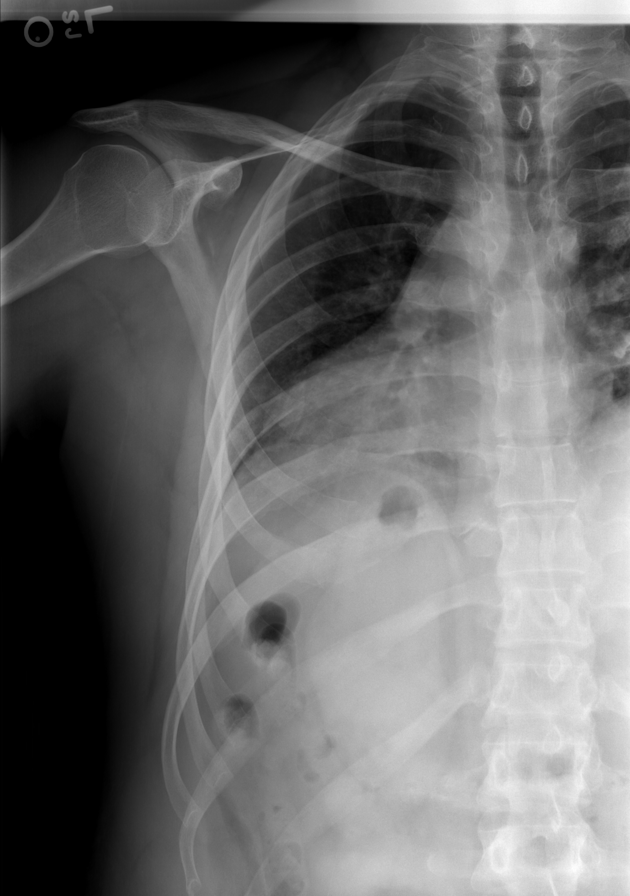

[w ribs ap/pa lower left]
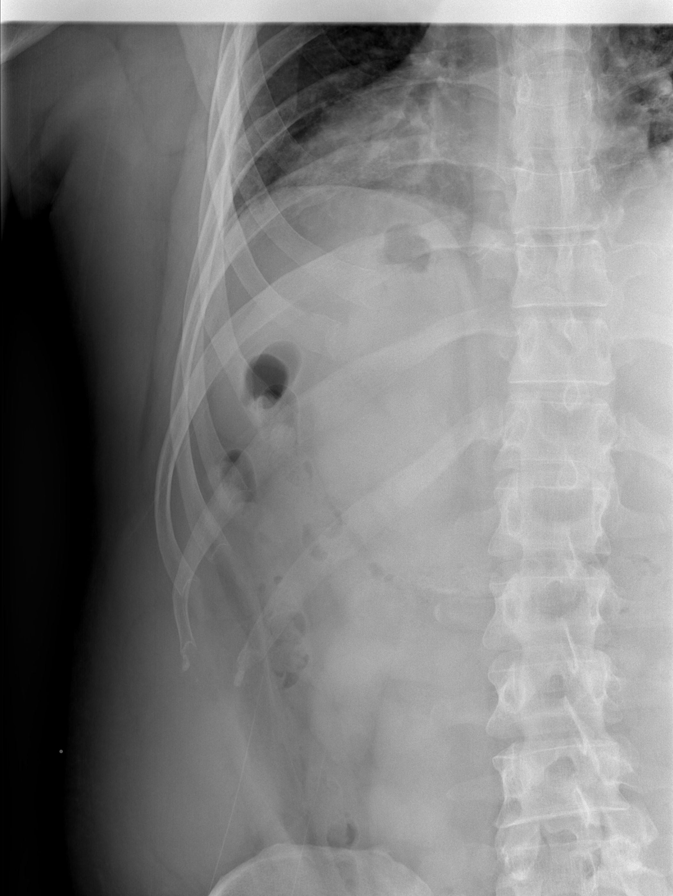

[w ribs oblique left]
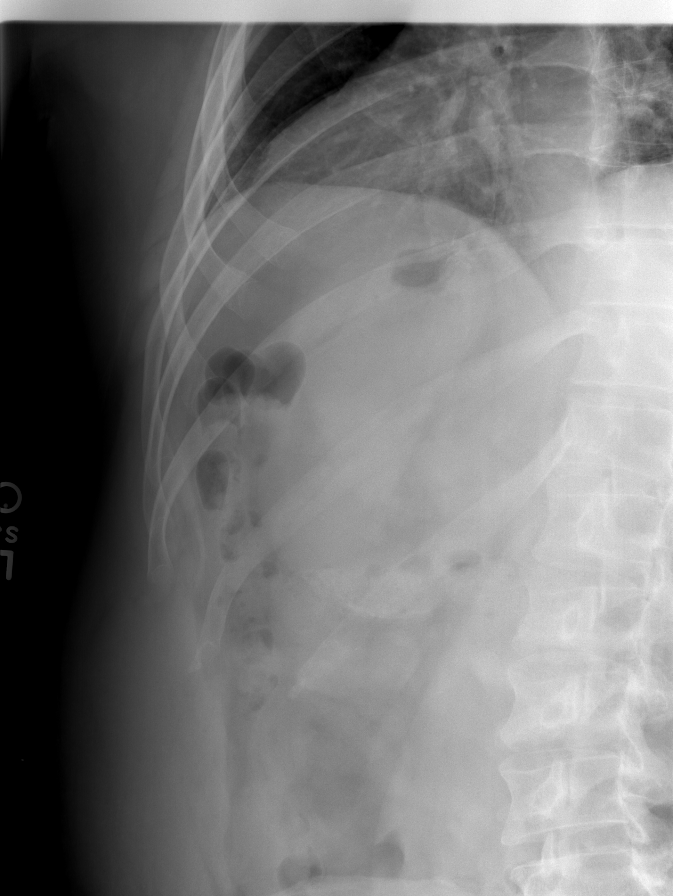

[3 of 3 positions shown; findings below may reference images not displayed]

FINDINGS: Views of the left ribs demonstrate no displaced rib
fracture.

No fractures or subluxations.
IMPRESSION: Negative exam.

## 2012-03-09 ENCOUNTER — Other Ambulatory Visit: Payer: Self-pay | Admitting: Family Medicine

## 2012-03-09 MED ORDER — FENOFIBRATE 145 MG PO TABS: 145.0000 mg | ORAL_TABLET | Freq: Every day | ORAL | Status: DC

## 2012-03-09 NOTE — Telephone Encounter (Signed)
Refill fenofibrate 145 mg tablet Qty 90 Take one by mouth daily Last written 2.11.13 Last OV 1.31.13

## 2012-03-16 ENCOUNTER — Other Ambulatory Visit: Payer: Self-pay

## 2012-03-16 DIAGNOSIS — E785 Hyperlipidemia, unspecified: Secondary | ICD-10-CM

## 2012-03-19 ENCOUNTER — Other Ambulatory Visit (INDEPENDENT_AMBULATORY_CARE_PROVIDER_SITE_OTHER): Payer: BC Managed Care – PPO

## 2012-03-19 DIAGNOSIS — E785 Hyperlipidemia, unspecified: Secondary | ICD-10-CM

## 2012-03-19 LAB — HEPATIC FUNCTION PANEL
AST: 26 U/L (ref 0–37)
Alkaline Phosphatase: 43 U/L (ref 39–117)
Bilirubin, Direct: 0.1 mg/dL (ref 0.0–0.3)
Total Bilirubin: 0.7 mg/dL (ref 0.3–1.2)

## 2012-03-19 LAB — LIPID PANEL
Total CHOL/HDL Ratio: 6
VLDL: 20.6 mg/dL (ref 0.0–40.0)

## 2012-03-19 NOTE — Progress Notes (Signed)
Labs only

## 2012-03-23 ENCOUNTER — Ambulatory Visit (INDEPENDENT_AMBULATORY_CARE_PROVIDER_SITE_OTHER): Payer: BC Managed Care – PPO | Admitting: Pharmacist

## 2012-03-23 VITALS — BP 130/94

## 2012-03-23 DIAGNOSIS — E785 Hyperlipidemia, unspecified: Secondary | ICD-10-CM

## 2012-03-23 NOTE — Patient Instructions (Addendum)
Increase Niaspan to 2000mg  daily (4 tablets).  Start Zetia 10mg  daily  Continue your healthy diet.  Try to incorporate more foods with fiber such as vegetables, fruits and whole wheat.   Continue to exercise at least 4-5 days per week.  Recheck labs in 1 month.

## 2012-03-24 NOTE — Assessment & Plan Note (Signed)
TC 250 (goal<200), TG 103 (goal<150), HDL 42 (goal>40) and LDL 210 (goal<130).  LFTs are WNL.  Patient's cholesterol has improved since last visit with titration of Niacin to 1500mg  daily.  Patient reports no problems with current medications and willing to try other therapy to reach goal.  Recommended increase Niacin to 2000mg  daily and initiate Zetia 10mg  daily.  Encouraged patient to increase fiber in diet through fruits and vegetables.  Follow up with patient in 1 month.

## 2012-03-24 NOTE — Progress Notes (Signed)
HPI: 46yo WM presented to the clinic today for a follow up with his lipids.  Currently managed with Niaspan 1500mg  daily and Tricor 145mg  daily.  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  Reports that cognitive function has markedly improved. Reports no problems with his current medications.    Diet: Reports that he is working on cutting back portion sizes  Breakfast: Toast with peanut butter and coffee  Lunch: Sandwiches  Dinner: White meats like check fist and lean pork   Exercise: Has cut back to just one ice hockey league this summer.  Plays hockey about 2x/week.  Additionally, he does yard work and would like to start an exercise routine with an exercise bike.   Current Outpatient Prescriptions  Medication Sig Dispense Refill  . fenofibrate (TRICOR) 145 MG tablet Take 1 tablet (145 mg total) by mouth daily.  90 tablet  1  . Multiple Vitamin (MULTIVITAMIN) tablet Take 1 tablet by mouth daily.      . niacin (NIASPAN) 500 MG CR tablet Take 2,000 mg by mouth at bedtime.      Marland Kitchen DISCONTD: Pitavastatin Calcium (LIVALO) 2 MG TABS Take 2 mg by mouth daily.        Allergies  Allergen Reactions  . Codeine     REACTION: HAZY,

## 2012-03-29 MED ORDER — EZETIMIBE 10 MG PO TABS: 10.0000 mg | ORAL_TABLET | Freq: Every day | ORAL | Status: DC

## 2012-04-28 ENCOUNTER — Ambulatory Visit: Payer: BC Managed Care – PPO | Admitting: Pharmacist

## 2012-04-28 ENCOUNTER — Other Ambulatory Visit (INDEPENDENT_AMBULATORY_CARE_PROVIDER_SITE_OTHER): Payer: BC Managed Care – PPO

## 2012-04-28 DIAGNOSIS — E785 Hyperlipidemia, unspecified: Secondary | ICD-10-CM

## 2012-04-28 LAB — HEPATIC FUNCTION PANEL
Bilirubin, Direct: 0.1 mg/dL (ref 0.0–0.3)
Total Bilirubin: 0.6 mg/dL (ref 0.3–1.2)
Total Protein: 7.2 g/dL (ref 6.0–8.3)

## 2012-04-28 LAB — LIPID PANEL
HDL: 42 mg/dL (ref >39.00)
VLDL: 22.2 mg/dL (ref 0.0–40.0)

## 2012-04-30 ENCOUNTER — Ambulatory Visit (INDEPENDENT_AMBULATORY_CARE_PROVIDER_SITE_OTHER): Payer: BC Managed Care – PPO | Admitting: Pharmacist

## 2012-04-30 DIAGNOSIS — E785 Hyperlipidemia, unspecified: Secondary | ICD-10-CM

## 2012-04-30 MED ORDER — NIACIN ER (ANTIHYPERLIPIDEMIC) 1000 MG PO TBCR: 1000.0000 mg | EXTENDED_RELEASE_TABLET | Freq: Two times a day (BID) | ORAL | Status: DC

## 2012-04-30 MED ORDER — EZETIMIBE 10 MG PO TABS: 10.0000 mg | ORAL_TABLET | Freq: Every day | ORAL | Status: DC

## 2012-04-30 MED ORDER — FENOFIBRATE 145 MG PO TABS: 145.0000 mg | ORAL_TABLET | Freq: Every day | ORAL | Status: DC

## 2012-04-30 NOTE — Assessment & Plan Note (Signed)
TC 209 (goal<200), TG 111 (goal<150), HDL 42 (goal>40) and LDL 146 (goal<130).  LFTs are WNL.  Patient's cholesterol has improved since last visit with titration of Niacin to 2000mg  daily and Zetia 10mg  daily.  Patient reports no problems with current medications.  Will continue current therapy.  Pt asked about starting fiber supplement.  Told pt this would be fine and may help decrease cholesterol.  Will recheck labs in 6 months.  If pt remains close to goal, will consider trying to titrate off some of his medications.

## 2012-04-30 NOTE — Progress Notes (Signed)
HPI: 46yo WM presented to the clinic today for a follow up with his lipids.  Currently managed with Niaspan 2000mg  daily, Zetia 10mg   and Tricor 145mg  daily.  He reports his cognitive function has almost completely resolved.  He has tolerated Zetia with no side effects.  He still has some hot flashes in the morning with Niaspan but states these are tolerable and inconsistent.  He had questions about changing from prescription to OTC.  Explained may have a little more flushing with OTC than prescription but he is going to check on the price difference.   Diet: Reports that he is working on cutting back portion sizes and limiting red meat.  Breakfast: Toast with peanut butter and coffee  Lunch: Sandwiches  Dinner: White meats like check fist and lean pork   Exercise: Has cut back to just one ice hockey league this summer.  Plays hockey about 2x/week.  Additionally, he does yard work and would like to start an exercise routine with an exercise bike.   Current Outpatient Prescriptions  Medication Sig Dispense Refill  . ezetimibe (ZETIA) 10 MG tablet Take 1 tablet (10 mg total) by mouth daily.  90 tablet  3  . fenofibrate (TRICOR) 145 MG tablet Take 1 tablet (145 mg total) by mouth daily.  30 tablet  11  . Multiple Vitamin (MULTIVITAMIN) tablet Take 1 tablet by mouth daily.      . niacin (NIASPAN) 1000 MG CR tablet Take 1 tablet (1,000 mg total) by mouth 2 (two) times daily.  60 tablet  11  . DISCONTD: ezetimibe (ZETIA) 10 MG tablet Take 1 tablet (10 mg total) by mouth daily.  35 tablet  0  . DISCONTD: fenofibrate (TRICOR) 145 MG tablet Take 1 tablet (145 mg total) by mouth daily.  90 tablet  1  . DISCONTD: fenofibrate (TRICOR) 145 MG tablet Take 1 tablet (145 mg total) by mouth daily.  90 tablet  3  . DISCONTD: Pitavastatin Calcium (LIVALO) 2 MG TABS Take 2 mg by mouth daily.        Allergies  Allergen Reactions  . Codeine     REACTION: HAZY,

## 2012-04-30 NOTE — Patient Instructions (Addendum)
Great job in the improvement in your cholesterol  Continue your current medications.   We will check your labs again in 6 months.

## 2012-05-07 ENCOUNTER — Other Ambulatory Visit: Payer: Self-pay | Admitting: Family Medicine

## 2012-05-07 ENCOUNTER — Other Ambulatory Visit: Payer: Self-pay | Admitting: Pharmacist

## 2012-05-07 NOTE — Telephone Encounter (Signed)
Pt has been out of Zetia and per pt [facility]mad[facility] was to call it in and he still doesn't have it at [facility]

## 2012-05-07 NOTE — Telephone Encounter (Signed)
[facility] filled zetia for 1 year.

## 2012-05-07 NOTE — Telephone Encounter (Signed)
refill zetia tablet 90 day supply, also request at [facility] No last fill & no instructions Looks like patient has not seen Lowne since 8.16.12 Last wrt. 6.28.13 by lowne qty 90 wt/3-refills

## 2012-05-11 MED ORDER — EZETIMIBE 10 MG PO TABS: 10.0000 mg | ORAL_TABLET | Freq: Every day | ORAL | Status: DC

## 2012-07-08 ENCOUNTER — Other Ambulatory Visit: Payer: Self-pay | Admitting: Family Medicine

## 2012-08-02 ENCOUNTER — Other Ambulatory Visit: Payer: Self-pay | Admitting: Family Medicine

## 2012-08-02 MED ORDER — EZETIMIBE 10 MG PO TABS: 10.0000 mg | ORAL_TABLET | Freq: Every day | ORAL | Status: DC

## 2012-08-02 NOTE — Telephone Encounter (Signed)
refill zetia tablet 10 mg requesting 90-day supply - no directions listed--last wrt 9.5.13 #30 wt/.1-refill ([facility]) Last ov 8.16.12 lab follow up -- no future appts scheduled

## 2012-08-17 ENCOUNTER — Encounter: Payer: Self-pay | Admitting: Family Medicine

## 2012-08-17 ENCOUNTER — Ambulatory Visit (INDEPENDENT_AMBULATORY_CARE_PROVIDER_SITE_OTHER): Payer: BC Managed Care – PPO | Admitting: Family Medicine

## 2012-08-17 VITALS — BP 110/78 | HR 85 | Temp 97.6°F | Ht 68.25 in | Wt 196.9 lb

## 2012-08-17 DIAGNOSIS — J029 Acute pharyngitis, unspecified: Secondary | ICD-10-CM

## 2012-08-17 NOTE — Patient Instructions (Addendum)
Your strep test is negative- this is good news! This is likely a viral illness and will improve w/ time Tylenol alternating w/ ibuprofen for sore throat or fever Drink plenty of fluids Rest! Hang in there!

## 2012-08-17 NOTE — Progress Notes (Signed)
  Subjective:    Patient ID: Glenn Nelson, male    DOB: 01-12-1966, 46 y.o.   MRN: [id number]  HPI URI- sxs started last week w/ 'sinus stuff'.  Woke up yesterday w/ sore throat.  No fever.  Painful to swallow yesterday but today is not as bad.  No known sick contacts.  Currently denies facial pain/pressure, nasal congestion.  + bilateral ear fullness.  Intermittent cough.  + LAD.   Review of Systems For ROS see HPI     Objective:   Physical Exam  Constitutional: He appears well-developed and well-nourished. No distress.  HENT:  Head: Normocephalic and atraumatic.  Right Ear: Tympanic membrane normal.  Left Ear: Tympanic membrane normal.  Nose: No mucosal edema or rhinorrhea. Right sinus exhibits no maxillary sinus tenderness and no frontal sinus tenderness. Left sinus exhibits no maxillary sinus tenderness and no frontal sinus tenderness.  Mouth/Throat: Mucous membranes are normal. No oropharyngeal exudate, posterior oropharyngeal edema or posterior oropharyngeal erythema.  Eyes: Conjunctivae normal and EOM are normal. Pupils are equal, round, and reactive to light.  Neck: Normal range of motion. Neck supple.  Cardiovascular: Normal rate, regular rhythm and normal heart sounds.   Pulmonary/Chest: Effort normal and breath sounds normal. No respiratory distress. He has no wheezes.  Lymphadenopathy:    He has no cervical adenopathy.  Skin: Skin is warm and dry.          Assessment & Plan:

## 2012-08-17 NOTE — Assessment & Plan Note (Signed)
No evidence of strep.  Likely viral illness.  Reviewed supportive care and red flags that should prompt return.  Pt expressed understanding and is in agreement w/ plan.

## 2012-10-07 ENCOUNTER — Other Ambulatory Visit (INDEPENDENT_AMBULATORY_CARE_PROVIDER_SITE_OTHER): Payer: BC Managed Care – PPO

## 2012-10-07 DIAGNOSIS — E785 Hyperlipidemia, unspecified: Secondary | ICD-10-CM

## 2012-10-07 LAB — LIPID PANEL
Cholesterol: 220 mg/dL — ABNORMAL HIGH (ref 0–200)
Total CHOL/HDL Ratio: 5
VLDL: 26.2 mg/dL (ref 0.0–40.0)

## 2012-10-07 LAB — HEPATIC FUNCTION PANEL
Albumin: 4.5 g/dL (ref 3.5–5.2)
Alkaline Phosphatase: 50 U/L (ref 39–117)
Bilirubin, Direct: 0.1 mg/dL (ref 0.0–0.3)
Total Bilirubin: 0.7 mg/dL (ref 0.3–1.2)

## 2012-10-13 ENCOUNTER — Ambulatory Visit (INDEPENDENT_AMBULATORY_CARE_PROVIDER_SITE_OTHER): Payer: BC Managed Care – PPO | Admitting: Pharmacist

## 2012-10-13 DIAGNOSIS — E785 Hyperlipidemia, unspecified: Secondary | ICD-10-CM

## 2012-10-13 NOTE — Patient Instructions (Addendum)
Continue your current medications.   Recheck labs in 4-6 months.  We will call you to set up the appt.

## 2012-10-24 NOTE — Assessment & Plan Note (Signed)
Pt's current cholesterol slightly higher when off Tricor.  TC- 220 (goal<200), TG- 131 (goal<150), HDL- 41 (goal>40), LDL- 165.  LFTs are WNL.  Pt has several questions regarding new lipid guidelines.  He is being treated for primary prevention.  His 10-year risk is <7.5%.  Given this information, will not increase medications at this time.  Will continue to work on lifestyle improvements.  Will plan to follow up with pt in 4-6 months.  He is looking at new jobs that may take him out of the state so will wait until Spring to make an appt.

## 2012-10-24 NOTE — Progress Notes (Signed)
HPI: 46yo WM presented to the clinic today for a follow up with his lipids.  Currently managed with Niaspan 2000mg  daily and Zetia 10mg .  He ran out of his Tricor about 2 months ago and stopped taking it at that time.  Since last visit, he has been laid off from his job and is currently looking for another position.  He reports his cognitive function has completely resolved and he is tolerating Zetia with no side effects.  He still has some hot flashes in the morning with Niaspan but states these are tolerable and inconsistent.     Diet: Reports that he is working on cutting back portion sizes and limiting red meat.   His weight has decreased due to a decrease in stress from not being at his job anymore.  Breakfast: Toast with peanut butter and coffee  Lunch: Sandwiches  Dinner: White meats like check fist and lean pork   Exercise:Continues to play hockey several days a week.   Current Outpatient Prescriptions  Medication Sig Dispense Refill  . Coenzyme Q10 (COQ-10) 100 MG CAPS Take 300 mg by mouth daily.      Marland Kitchen ezetimibe (ZETIA) 10 MG tablet Take 1 tablet (10 mg total) by mouth daily.  90 tablet  0  . niacin (NIASPAN) 1000 MG CR tablet Take 1 tablet (1,000 mg total) by mouth 2 (two) times daily.  60 tablet  11  . Multiple Vitamin (MULTIVITAMIN) tablet Take 1 tablet by mouth daily.      . [DISCONTINUED] Pitavastatin Calcium (LIVALO) 2 MG TABS Take 2 mg by mouth daily.        Allergies  Allergen Reactions  . Codeine     REACTION: HAZY,

## 2012-10-28 ENCOUNTER — Telehealth: Payer: Self-pay | Admitting: Family Medicine

## 2012-10-28 MED ORDER — EZETIMIBE 10 MG PO TABS: 10.0000 mg | ORAL_TABLET | Freq: Every day | ORAL | Status: DC

## 2012-10-28 NOTE — Addendum Note (Signed)
Addended by: Candie Echevaria L on: 10/28/2012 04:51 PM   Modules accepted: Orders

## 2012-10-28 NOTE — Telephone Encounter (Addendum)
Per Dr Beverely Low ok to fill for #90 1 refill. Pt last OV acute 08-17-12, Pt last lipid profile 10-07-12 @ lipid clinic, provider currently out of office will fill med.

## 2012-10-28 NOTE — Telephone Encounter (Signed)
Refill: Zetia tablet 10 mg. Take 1 by mouth daily. 90 day supply.

## 2012-10-28 NOTE — Telephone Encounter (Signed)
Pt has not been seen within a year. OK to refill? 

## 2012-10-28 NOTE — Telephone Encounter (Signed)
Ok for #30, no refills.  Needs to schedule OV w/ Dr Laury Axon

## 2013-01-27 ENCOUNTER — Encounter: Payer: Self-pay | Admitting: Pharmacist

## 2013-02-01 ENCOUNTER — Other Ambulatory Visit: Payer: Self-pay | Admitting: *Deleted

## 2013-02-01 MED ORDER — NIACIN ER (ANTIHYPERLIPIDEMIC) 1000 MG PO TBCR: 1000.0000 mg | EXTENDED_RELEASE_TABLET | Freq: Two times a day (BID) | ORAL | Status: DC

## 2013-02-01 MED ORDER — EZETIMIBE 10 MG PO TABS: 10.0000 mg | ORAL_TABLET | Freq: Every day | ORAL | Status: DC

## 2013-02-01 NOTE — Telephone Encounter (Signed)
Pt left VM stating that his insurance has changed so he needs to have his zetia and niaspan [facility]. Left Pt VM Rx sent.

## 2013-03-14 ENCOUNTER — Other Ambulatory Visit (INDEPENDENT_AMBULATORY_CARE_PROVIDER_SITE_OTHER): Payer: Medicare HMO

## 2013-03-14 DIAGNOSIS — E785 Hyperlipidemia, unspecified: Secondary | ICD-10-CM

## 2013-03-14 LAB — HEPATIC FUNCTION PANEL
ALT: 36 U/L (ref 0–53)
AST: 22 U/L (ref 0–37)
Alkaline Phosphatase: 46 U/L (ref 39–117)
Bilirubin, Direct: 0.1 mg/dL (ref 0.0–0.3)
Total Bilirubin: 0.8 mg/dL (ref 0.3–1.2)

## 2013-03-14 LAB — LIPID PANEL: Total CHOL/HDL Ratio: 6

## 2013-03-17 ENCOUNTER — Ambulatory Visit (INDEPENDENT_AMBULATORY_CARE_PROVIDER_SITE_OTHER): Payer: Medicare HMO | Admitting: Pharmacist

## 2013-03-17 VITALS — Wt 191.8 lb

## 2013-03-17 DIAGNOSIS — E785 Hyperlipidemia, unspecified: Secondary | ICD-10-CM

## 2013-03-17 NOTE — Progress Notes (Signed)
Glenn Nelson is here for follow up of his hyperlipidemia.  He is currently on Niaspan 1000 mg BID and Zetia 10 mg daily.  He reports no issues with this regimen, but he was previously on TriCor which caused him to have memory issues. He reports he is moving to [facility] in the next few days.   Diet: Breakfast - toast with a small amount of butter, jam, or peanut butter. Coffee with creamer Lunch - [place] or chicken sandwich, salad with oil and vinegar dressing Dinner - sometimes skips, sometimes white meat plus a vegetable. Red meat on weekends only.   No snacks. Drinks mostly water throughout the day.  Exercise: Reports playing ice hockey a couple times/week starting in October, but for now he can only play sporadically d/t a new job.    Current Outpatient Prescriptions on File Prior to Visit  Medication Sig Dispense Refill  . Coenzyme Q10 (COQ-10) 100 MG CAPS Take 300 mg by mouth daily.      Marland Kitchen ezetimibe (ZETIA) 10 MG tablet Take 1 tablet (10 mg total) by mouth daily.  90 tablet  0  . Multiple Vitamin (MULTIVITAMIN) tablet Take 1 tablet by mouth daily.      . niacin (NIASPAN) 1000 MG CR tablet Take 1 tablet (1,000 mg total) by mouth 2 (two) times daily.  90 tablet  0  . [DISCONTINUED] Pitavastatin Calcium (LIVALO) 2 MG TABS Take 2 mg by mouth daily.       No current facility-administered medications on file prior to visit.   Allergies  Allergen Reactions  . Codeine     REACTION: HAZY,   Lipid Panel     Component Value Date/Time   CHOL 220* 03/14/2013 0823   TRIG 114.0 03/14/2013 0823   HDL 39.10 03/14/2013 0823   CHOLHDL 6 03/14/2013 0823   VLDL 22.8 03/14/2013 0823   LDLCALC 124* 09/19/2011 0808    Hepatic Function Panel     Component Value Date/Time   PROT 7.1 03/14/2013 0823   ALBUMIN 4.3 03/14/2013 0823   AST 22 03/14/2013 0823   ALT 36 03/14/2013 0823   ALKPHOS 46 03/14/2013 0823   BILITOT 0.8 03/14/2013 0823   BILIDIR 0.1 03/14/2013 4098

## 2013-03-17 NOTE — Patient Instructions (Signed)
Continue Niaspan 1000 mg twice daily and Zetia 10 mg daily.  Continue to work on improving diet and increasing exercise.  Follow up with your PCP in [place].

## 2013-03-17 NOTE — Assessment & Plan Note (Addendum)
Cholesterol remains stable off TriCor.  TC 220 (goal <200), TG 114 (goal <150), HDL 39 (goal >40), LDL 161. LFTs wnl. He is being treated for primary prevention, and his 10-year risk is <7.5%.  Continue current medication regimen.  Pt moving to [place] and will follow up with a PCP there.  Encouraged continued lifestyle improvements.    Reviewed with Weston Brass, PharmD, CPP

## 2013-03-30 ENCOUNTER — Telehealth: Payer: Self-pay | Admitting: General Practice

## 2013-03-30 MED ORDER — NIACIN ER (ANTIHYPERLIPIDEMIC) 1000 MG PO TBCR: 1000.0000 mg | EXTENDED_RELEASE_TABLET | Freq: Two times a day (BID) | ORAL | Status: DC

## 2013-03-30 NOTE — Telephone Encounter (Signed)
Med filled.  

## 2013-03-30 NOTE — Telephone Encounter (Signed)
[facility] [place]. Called pt to verify if he needed his Niaspan sent here or to his mail order. Waiting on Pt to return call.

## 2013-04-13 ENCOUNTER — Other Ambulatory Visit: Payer: Self-pay | Admitting: Family Medicine

## 2013-09-07 ENCOUNTER — Other Ambulatory Visit: Payer: Self-pay

## 2013-09-07 MED ORDER — NIACIN ER (ANTIHYPERLIPIDEMIC) 1000 MG PO TBCR: 1000.0000 mg | EXTENDED_RELEASE_TABLET | Freq: Two times a day (BID) | ORAL | Status: AC

## 2013-10-20 ENCOUNTER — Other Ambulatory Visit: Payer: Self-pay

## 2013-10-20 MED ORDER — EZETIMIBE 10 MG PO TABS: ORAL_TABLET | ORAL | Status: AC

## 2013-12-03 ENCOUNTER — Other Ambulatory Visit: Payer: Self-pay | Admitting: Family Medicine

## 2014-04-17 ENCOUNTER — Other Ambulatory Visit: Payer: Self-pay

## 2014-04-17 NOTE — Telephone Encounter (Signed)
Rx request for Zetia, patient has not been seen since 2013. Letter mailed 10/10/13 No response and no pending appointments,  Rx denied per protocol.    KP
